# Patient Record
Sex: Male | Born: 1995 | Race: White | Hispanic: No | Marital: Single | Smoking: Former smoker
Health system: Southern US, Community
[De-identification: ages and names within clinical notes are randomized; demographics above are authoritative.]

## PROBLEM LIST (undated history)

## (undated) DIAGNOSIS — G4700 Insomnia, unspecified: Secondary | ICD-10-CM

## (undated) DIAGNOSIS — J45909 Unspecified asthma, uncomplicated: Secondary | ICD-10-CM

---
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
## 2015-07-28 ENCOUNTER — Ambulatory Visit (INDEPENDENT_AMBULATORY_CARE_PROVIDER_SITE_OTHER): Payer: Self-pay

## 2015-07-28 ENCOUNTER — Ambulatory Visit
Admission: EM | Admit: 2015-07-28 | Discharge: 2015-07-28 | Disposition: A | Discharge status: Home / Self Care | Payer: Self-pay | Attending: Family Medicine | Admitting: Family Medicine

## 2015-07-28 DIAGNOSIS — S20212A Contusion of left front wall of thorax, initial encounter: Secondary | ICD-10-CM

## 2015-07-28 DIAGNOSIS — S298XXA Other specified injuries of thorax, initial encounter: Secondary | ICD-10-CM

## 2015-07-28 DIAGNOSIS — J301 Allergic rhinitis due to pollen: Secondary | ICD-10-CM

## 2015-07-28 DIAGNOSIS — H6593 Unspecified nonsuppurative otitis media, bilateral: Secondary | ICD-10-CM

## 2015-07-28 DIAGNOSIS — S29011A Strain of muscle and tendon of front wall of thorax, initial encounter: Secondary | ICD-10-CM

## 2015-07-28 DIAGNOSIS — S2341XA Sprain of ribs, initial encounter: Secondary | ICD-10-CM

## 2015-07-28 HISTORY — DX: Insomnia, unspecified: G47.00

## 2015-07-28 HISTORY — DX: Unspecified asthma, uncomplicated: J45.909

## 2015-07-28 MED ORDER — FLUTICASONE PROPIONATE 50 MCG/ACT NA SUSP: 1.0000 | Freq: Two times a day (BID) | NASAL | Status: DC

## 2015-07-28 MED ORDER — CETIRIZINE HCL 10 MG PO CHEW: 10.0000 mg | CHEWABLE_TABLET | Freq: Every day | ORAL | Status: AC

## 2015-07-28 MED ORDER — SALINE SPRAY 0.65 % NA SOLN: 2.0000 | NASAL | Status: DC

## 2015-07-28 MED ORDER — IBUPROFEN 800 MG PO TABS: 800.0000 mg | ORAL_TABLET | Freq: Three times a day (TID) | ORAL | Status: DC

## 2015-07-28 NOTE — ED Notes (Signed)
States slipped and fell 07/13/15 landing left lateral ribs on edge of brick steps. C/o pain with inspiration and with movements

## 2015-07-28 NOTE — Discharge Instructions (Signed)
Allergic Rhinitis Allergic rhinitis is when the mucous membranes in the nose respond to allergens. Allergens are particles in the air that cause your body to have an allergic reaction. This causes you to release allergic antibodies. Through a chain of events, these eventually cause you to release histamine into the blood stream. Although meant to protect the body, it is this release of histamine that causes your discomfort, such as frequent sneezing, congestion, and an itchy, runny nose.  CAUSES Seasonal allergic rhinitis (hay fever) is caused by pollen allergens that may come from grasses, trees, and weeds. Year-round allergic rhinitis (perennial allergic rhinitis) is caused by allergens such as house dust mites, pet dander, and mold spores. SYMPTOMS  Nasal stuffiness (congestion).  Itchy, runny nose with sneezing and tearing of the eyes. DIAGNOSIS Your health care provider can help you determine the allergen or allergens that trigger your symptoms. If you and your health care provider are unable to determine the allergen, skin or blood testing may be used. Your health care provider will diagnose your condition after taking your health history and performing a physical exam. Your health care provider may assess you for other related conditions, such as asthma, pink eye, or an ear infection. TREATMENT Allergic rhinitis does not have a cure, but it can be controlled by:  Medicines that block allergy symptoms. These may include allergy shots, nasal sprays, and oral antihistamines.  Avoiding the allergen. Hay fever may often be treated with antihistamines in pill or nasal spray forms. Antihistamines block the effects of histamine. There are over-the-counter medicines that may help with nasal congestion and swelling around the eyes. Check with your health care provider before taking or giving this medicine. If avoiding the allergen or the medicine prescribed do not work, there are many new medicines  your health care provider can prescribe. Stronger medicine may be used if initial measures are ineffective. Desensitizing injections can be used if medicine and avoidance does not work. Desensitization is when a patient is given ongoing shots until the body becomes less sensitive to the allergen. Make sure you follow up with your health care provider if problems continue. HOME CARE INSTRUCTIONS It is not possible to completely avoid allergens, but you can reduce your symptoms by taking steps to limit your exposure to them. It helps to know exactly what you are allergic to so that you can avoid your specific triggers. SEEK MEDICAL CARE IF:  You have a fever.  You develop a cough that does not stop easily (persistent).  You have shortness of breath.  You start wheezing.  Symptoms interfere with normal daily activities.   This information is not intended to replace advice given to you by your health care provider. Make sure you discuss any questions you have with your health care provider.   Document Released: 03/10/2001 Document Revised: 07/06/2014 Document Reviewed: 02/20/2013 Elsevier Interactive Patient Education 2016 Else[employer] Inc[employer]  Blunt Chest Trauma Blunt chest trauma is an injury caused by a blow to the chest. These chest injuries can be very painful. Blunt chest trauma often results in bruised or broken (fractured) ribs. Most cases of bruised and fractured ribs from blunt chest traumas get better after 1 to 3 weeks of rest and pain medicine. Often, the soft tissue in the chest wall is also injured, causing pain and bruising. Internal organs, such as the heart and lungs, may also be injured. Blunt chest trauma can lead to serious medical problems. This injury requires immediate medical care. CAUSES  Motor vehicle collisions.  Falls.  Physical violence.  Sports injuries. SYMPTOMS   Chest pain. The pain may be worse when you move or breathe deeply.  Shortness of  breath.  Lightheadedness.  Bruising.  Tenderness.  Swelling. DIAGNOSIS  Your caregiver will do a physical exam. X-rays may be taken to look for fractures. However, minor rib fractures may not show up on X-rays until a few days after the injury. If a more serious injury is suspected, further imaging tests may be done. This may include ultrasounds, computed tomography (CT) scans, or magnetic resonance imaging (MRI). TREATMENT  Treatment depends on the severity of your injury. Your caregiver may prescribe pain medicines and deep breathing exercises. HOME CARE INSTRUCTIONS  Limit your activities until you can move around without much pain.  Do not do any strenuous work until your injury is healed.  Put ice on the injured area.  Put ice in a plastic bag.  Place a towel between your skin and the bag.  Leave the ice on for 15-20 minutes, 03-04 times a day.  You may wear a rib belt as directed by your caregiver to reduce pain.  Practice deep breathing as directed by your caregiver to keep your lungs clear.  Only take over-the-counter or prescription medicines for pain, fever, or discomfort as directed by your caregiver. SEEK IMMEDIATE MEDICAL CARE IF:   You have increasing pain or shortness of breath.  You cough up blood.  You have nausea, vomiting, or abdominal pain.  You have a fever.  You feel dizzy, weak, or you faint. MAKE SURE YOU:  Understand these instructions. Will watch your condition.  Rib Contusion A rib contusion is a deep bruise on your rib area. Contusions are the result of a blunt trauma that causes bleeding and injury to the tissues under the skin. A rib contusion may involve bruising of the ribs and of the skin and muscles in the area. The skin overlying the contusion may turn blue, purple, or yellow. Minor injuries will give you a painless contusion, but more severe contusions may stay painful and swollen for a few weeks. CAUSES  A contusion is usually  caused by a blow, trauma, or direct force to an area of the body. This often occurs while playing contact sports. SYMPTOMS Swelling and redness of the injured area. Discoloration of the injured area. Tenderness and soreness of the injured area. Pain with or without movement. DIAGNOSIS  The diagnosis can be made by taking a medical history and performing a physical exam. An X-ray, CT scan, or MRI may be needed to determine if there were any associated injuries, such as broken bones (fractures) or internal injuries. TREATMENT  Often, the best treatment for a rib contusion is rest. Icing or applying cold compresses to the injured area may help reduce swelling and inflammation. Deep breathing exercises may be recommended to reduce the risk of partial lung collapse and pneumonia. Over-the-counter or prescription medicines may also be recommended for pain control. HOME CARE INSTRUCTIONS  Apply ice to the injured area: Put ice in a plastic bag. Place a towel between your skin and the bag. Leave the ice on for 20 minutes, 2-3 times per day. Take medicines only as directed by your health care provider. Rest the injured area. Avoid strenuous activity and any activities or movements that cause pain. Be careful during activities and avoid bumping the injured area. Perform deep-breathing exercises as directed by your health care provider. Do not lift anything that is  heavier than 5 lb (2.3 kg) until your health care provider approves. Do not use any tobacco products, including cigarettes, chewing tobacco, or electronic cigarettes. If you need help quitting, ask your health care provider. SEEK MEDICAL CARE IF:  You have increased bruising or swelling. You have pain that is not controlled with treatment. You have a fever. SEEK IMMEDIATE MEDICAL CARE IF:  You have difficulty breathing or shortness of breath. You develop a continual cough, or you cough up thick or bloody sputum. You feel sick to your  stomach (nauseous), you throw up (vomit), or you have abdominal pain.   This information is not intended to replace advice given to you by your health care provider. Make sure you discuss any questions you have with your health care provider.   Document Released: 03/10/2001 Document Revised: 07/06/2014 Document Reviewed: 03/27/2014 Elsevier Interactive Patient Education [employer]    Will get help right away if you are not doing well or get worse.   This information is not intended to replace advice given to you by your health care provider. Make sure you discuss any questions you have with your health care provider.   Document Released: 07/23/2004 Document Revised: 07/06/2014 Document Reviewed: 12/12/2014 Elsevier Interactive Patient Education 2016 Else[employer] Inc. Otitis Media With Effusion Otitis media with effusion is the presence of fluid in the middle ear. This is a common problem in children, which often follows ear infections. It may be present for weeks or longer after the infection. Unlike an acute ear infection, otitis media with effusion refers only to fluid behind the ear drum and not infection. Children with repeated ear and sinus infections and allergy problems are the most likely to get otitis media with effusion. CAUSES  The most frequent cause of the fluid buildup is dysfunction of the eustachian tubes. These are the tubes that drain fluid in the ears to the back of the nose (nasopharynx). SYMPTOMS   The main symptom of this condition is hearing loss. As a result, you or your child may:  Listen to the TV at a loud volume.  Not respond to questions.  Ask "what" often when spoken to.  Mistake or confuse one sound or word for another.  There may be a sensation of fullness or pressure but usually not pain. DIAGNOSIS   Your health care provider will diagnose this condition by examining you or your child's ears.  Your health care provider may test the pressure  in you or your child's ear with a tympanometer.  A hearing test may be conducted if the problem persists. TREATMENT   Treatment depends on the duration and the effects of the effusion.  Antibiotics, decongestants, nose drops, and cortisone-type drugs (tablets or nasal spray) may not be helpful.  Children with persistent ear effusions may have delayed language or behavioral problems. Children at risk for developmental delays in hearing, learning, and speech may require referral to a specialist earlier than children not at risk.  You or your child's health care provider may suggest a referral to an ear, nose, and throat surgeon for treatment. The following may help restore normal hearing:  Drainage of fluid.  Placement of ear tubes (tympanostomy tubes).  Removal of adenoids (adenoidectomy). HOME CARE INSTRUCTIONS   Avoid secondhand smoke.  Infants who are breastfed are less likely to have this condition.  Avoid feeding infants while they are lying flat.  Avoid known environmental allergens.  Avoid people who are sick. SEEK MEDICAL CARE IF:  Hearing is not better in 3 months.  Hearing is worse.  Ear pain.  Drainage from the ear.  Dizziness. MAKE SURE YOU:   Understand these instructions.  Will watch your condition.  Will get help right away if you are not doing well or get worse.   This information is not intended to replace advice given to you by your health care provider. Make sure you discuss any questions you have with your health care provider.   Document Released: 07/23/2004 Document Revised: 07/06/2014 Document Reviewed: 01/10/2013 Elsevier Interactive Patient Education [employer]

## 2015-07-28 NOTE — ED Provider Notes (Signed)
CSN: [id number]     Arrival date & time 07/28/15  1421 History   First MD Initiated Contact with Patient 07/28/15 1512     Chief Complaint  Patient presents with  . Rib Injury   (Consider location/radiation/quality/duration/timing/severity/associated sxs/prior Treatment) HPI Comments: Single caucasian male fell in the rain on steps while walking dog 13 Jul 2015 still having pain thinks he may have broken rib here for evaluation  Pain with deep breaths and certain movements.  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  The history is provided by the patient.    Past Medical History  Diagnosis Date  . Asthma   . Insomnia    History reviewed. No pertinent past surgical history. History reviewed. No pertinent family history. Social History  Substance Use Topics  . Smoking status: Former Games developer  . Smokeless tobacco: None  . Alcohol Use: No    Review of Systems  Constitutional: Negative for fever, chills, diaphoresis, activity change, appetite change, fatigue and unexpected weight change.  HENT: Positive for congestion and postnasal drip. Negative for dental problem, drooling, ear discharge, ear pain, facial swelling, hearing loss, mouth sores, nosebleeds, rhinorrhea, sinus pressure, sneezing, sore throat, tinnitus, trouble swallowing and voice change.   Eyes: Negative for photophobia, pain, discharge, redness, itching and visual disturbance.  Respiratory: Positive for cough. Negative for choking, chest tightness, shortness of breath, wheezing and stridor.   Cardiovascular: Positive for chest pain. Negative for palpitations and leg swelling.  Gastrointestinal: Negative for nausea, vomiting, abdominal pain, diarrhea, constipation, blood in stool and abdominal distention.  Endocrine: Negative for cold intolerance and heat intolerance.  Genitourinary: Negative for dysuria.  Musculoskeletal: Negative for myalgias,  back pain, joint swelling, arthralgias, gait problem, neck pain and neck stiffness.  Skin: Negative for color change, pallor, rash and wound.  Allergic/Immunologic: Positive for environmental allergies. Negative for food allergies and immunocompromised state.  Neurological: Negative for dizziness, tremors, seizures, syncope, facial asymmetry, speech difficulty, weakness, light-headedness, numbness and headaches.  Hematological: Negative for adenopathy. Does not bruise/bleed easily.  Psychiatric/Behavioral: Negative for behavioral problems, confusion, sleep disturbance and agitation.    Allergies  Peanuts and Shellfish allergy  Home Medications   Prior to Admission medications   Medication Sig Start Date End Date Taking? Authorizing Provider  cetirizine (ZYRTEC) 10 MG chewable tablet Chew 1 tablet (10 mg total) by mouth daily. 07/28/15 08/27/15  Barbaraann Barthel, NP  fluticasone (FLONASE) 50 MCG/ACT nasal spray Place 1 spray into both nostrils 2 (two) times daily. 07/28/15   Barbaraann Barthel, NP  ibuprofen (ADVIL,MOTRIN) 800 MG tablet Take 1 tablet (800 mg total) by mouth 3 (three) times daily. 07/28/15   Barbaraann Barthel, NP  sodium chloride (OCEAN) 0.65 % SOLN nasal spray Place 2 sprays into both nostrils every 2 (two) hours while awake. 07/28/15   Barbaraann Barthel, NP   Meds Ordered and Administered this Visit  Medications - No data to display  BP 146/61 mmHg  Pulse 76  Temp(Src) 97.5 F (36.4 C) (Tympanic)  Resp 16  Ht  (1.676 m)  Wt 160 lb (72.576 kg)  BMI 25.84 kg/m2  SpO2 100% No data found.   Physical Exam  Constitutional: He is oriented to person, place, and time. Vital signs are normal. He appears well-developed and well-nourished. He is active and cooperative.  Non-toxic appearance. He does not have a sickly appearance. He does not appear ill. No  distress.  HENT:  Head: Normocephalic and atraumatic.  Right Ear: External ear and ear canal normal. A middle ear  effusion is present.  Left Ear: Hearing, external ear and ear canal normal. A middle ear effusion is present.  Nose: Mucosal edema and rhinorrhea present. No nose lacerations, sinus tenderness, nasal deformity, septal deviation or nasal septal hematoma. No epistaxis.  No foreign bodies. Right sinus exhibits no maxillary sinus tenderness and no frontal sinus tenderness. Left sinus exhibits no maxillary sinus tenderness and no frontal sinus tenderness.  Mouth/Throat: Uvula is midline and mucous membranes are normal. Mucous membranes are not pale, not dry and not cyanotic. He does not have dentures. No oral lesions. No trismus in the jaw. Normal dentition. No dental abscesses, uvula swelling, lacerations or dental caries. Posterior oropharyngeal edema and posterior oropharyngeal erythema present. No oropharyngeal exudate or tonsillar abscesses.  Cobblestoning posterior pharynx; bilateral TMs with air fluid level slight opacity; bilateral nasal turbinates with edema/erythema  Eyes: Conjunctivae, EOM and lids are normal. Pupils are equal, round, and reactive to light. Right eye exhibits no chemosis, no discharge, no exudate and no hordeolum. No foreign body present in the right eye. Left eye exhibits no chemosis, no discharge, no exudate and no hordeolum. No foreign body present in the left eye. Right conjunctiva is not injected. Right conjunctiva has no hemorrhage. Left conjunctiva is not injected. Left conjunctiva has no hemorrhage. No scleral icterus. Right eye exhibits normal extraocular motion and no nystagmus. Left eye exhibits normal extraocular motion and no nystagmus. Right pupil is round and reactive. Left pupil is round and reactive. Pupils are equal.  Neck: Trachea normal and normal range of motion. Neck supple. No tracheal tenderness, no spinous process tenderness and no muscular tenderness present. No rigidity. No tracheal deviation, no edema, no erythema and normal range of motion present. No thyroid  mass and no thyromegaly present.  Cardiovascular: Normal rate, regular rhythm, S1 normal, S2 normal, normal heart sounds and intact distal pulses.  PMI is not displaced.  Exam reveals no gallop and no friction rub.   No murmur heard.   Pulmonary/Chest: Effort normal and breath sounds normal. No stridor. No respiratory distress. He has no decreased breath sounds. He has no wheezes. He has no rhonchi. He has no rales.  Abdominal: Soft. He exhibits no distension.  Musculoskeletal: Normal range of motion. He exhibits no edema or tenderness.       Right shoulder: Normal.       Left shoulder: Normal.       Right elbow: Normal.      Left elbow: Normal.       Right hip: Normal.       Left hip: Normal.       Right knee: Normal.       Left knee: Normal.       Cervical back: Normal.       Thoracic back: Normal.       Lumbar back: Normal.       Right hand: Normal.       Left hand: Normal.  Lymphadenopathy:       Head (right side): No submental, no submandibular, no tonsillar, no preauricular, no posterior auricular and no occipital adenopathy present.       Head (left side): No submental, no submandibular, no tonsillar, no preauricular, no posterior auricular and no occipital adenopathy present.    He has no cervical adenopathy.       Right cervical: No superficial cervical, no deep cervical  and no posterior cervical adenopathy present.      Left cervical: No superficial cervical, no deep cervical and no posterior cervical adenopathy present.  Neurological: He is alert and oriented to person, place, and time. He displays no atrophy and no tremor. No cranial nerve deficit or sensory deficit. He exhibits normal muscle tone. He displays no seizure activity. Coordination and gait normal. GCS eye subscore is 4. GCS verbal subscore is 5. GCS motor subscore is 6.  Skin: Skin is warm, dry and intact. Ecchymosis noted. No abrasion, no bruising, no burn, no laceration, no lesion, no petechiae and no rash  noted. He is not diaphoretic. No cyanosis or erythema. No pallor. Nails show no clubbing.     Psychiatric: He has a normal mood and affect. His speech is normal and behavior is normal. Judgment and thought content normal. Cognition and memory are normal.  Nursing note and vitals reviewed.   ED Course  Procedures (including critical care time)  Labs Review Labs Reviewed - No data to display  Imaging Review Dg Ribs Unilateral W/chest Left  07/28/2015  CLINICAL DATA:  Larey Seat 2 weeks ago on his left side with persistent left rib pain. EXAM: LEFT RIBS AND CHEST - 3+ VIEW COMPARISON:  None. FINDINGS: The lungs are clear without focal consolidation, edema, effusion or pneumothorax. Cardio pericardial silhouette is within normal limits for size. Imaged bony structures of the thorax are intact. Oblique views of the left ribs were obtained with a radiopaque BB localizing the area of patient concern. No evidence for left-sided rib fracture. IMPRESSION: Negative. Electronically Signed   By: Kennith Center M.D.   On: 07/28/2015 15:22    1515 patient at xray.   1530 Discussed xray results with patient given copy of radiology report negative for fracture or pneumothorax, fluid in lungs.  Probable contusion from blunt trauma, intercostal muscle strain.  Plants budding due to warm weather seasonal spring allergies may be starting earlier than usual and patient to restart his zyrtec  po daily.  Patient to follow up for re-evaluation if worsening pain, SOB, hemoptysis, productive cough.  Patient verbalized understanding of information/instructions, agreed with plan of care and had no further questions at this time. MDM   1. Otitis media with effusion, bilateral   2. Rib contusion, left, initial encounter   3. Intercostal muscle strain, initial encounter   4. Allergic rhinitis due to pollen    Supportive treatment.   No evidence of invasive bacterial infection, non toxic and well hydrated.  This is most  likely self limiting viral infection.  I do not see where any further testing or imaging is necessary at this time.   I will suggest supportive care, rest, good hygiene and encourage the patient to take adequate fluids.  The patient is to return to clinic or EMERGENCY ROOM if symptoms worsen or change significantly e.g. ear pain, fever, purulent discharge from ears or bleeding.  Exitcare handout on otitis media with effusion given to patient.  Patient verbalized agreement and understanding of treatment plan.    Patient given work excuse for today.  Patient was instructed to rest, ice, NSAID of choice x 2 weeks and ROM exercises.  Activity as tolerated.  Patient is to take OTC po NSAIDS as needed.  Exitcare handout on blunt trauma, rib contusion given to patient.  Follow up with PCM if no improvement in symptoms with prescribed care.  Patient verbalized agreement and understanding of treatment plan and had no further questions at this  time.  P2:  Injury Prevention and Fitness.  Restart zyrtec 10mg  po daily.  If no relief add flonase 1 spray each nostril BID and nasal saline 2 sprays each nostril q2h prn congestion.  Patient may use normal saline nasal spray as needed.  Consider antihistamine or nasal steroid use.  Avoid triggers if possible.  Shower prior to bedtime if exposed to triggers.  If allergic dust/dust mites recommend mattress/pillow covers/encasements; washing linens, vacuuming, sweeping, dusting weekly.  Call or return to clinic as needed if these symptoms worsen or fail to improve as anticipated.   Exitcare handout on allergic rhinitis given to patient.  Patient verbalized understanding of instructions, agreed with plan of care and had no further questions at this time.  P2:  Avoidance and hand washing.    Barbaraann Barthel, NP 07/28/15 1554

## 2016-11-03 DIAGNOSIS — F411 Generalized anxiety disorder: Secondary | ICD-10-CM | POA: Insufficient documentation

## 2016-11-03 DIAGNOSIS — Z8659 Personal history of other mental and behavioral disorders: Secondary | ICD-10-CM | POA: Insufficient documentation

## 2016-11-03 DIAGNOSIS — F5101 Primary insomnia: Secondary | ICD-10-CM | POA: Insufficient documentation

## 2017-05-08 IMAGING — CR DG RIBS W/ CHEST 3+V*L*
5 series · 5 of 5 positions shown · non-contrast
Comparison: None.

CLINICAL DATA: Fell 2 weeks ago on his left side with persistent
left rib pain.

EXAM:
LEFT RIBS AND CHEST - 3+ VIEW

[chest pa]
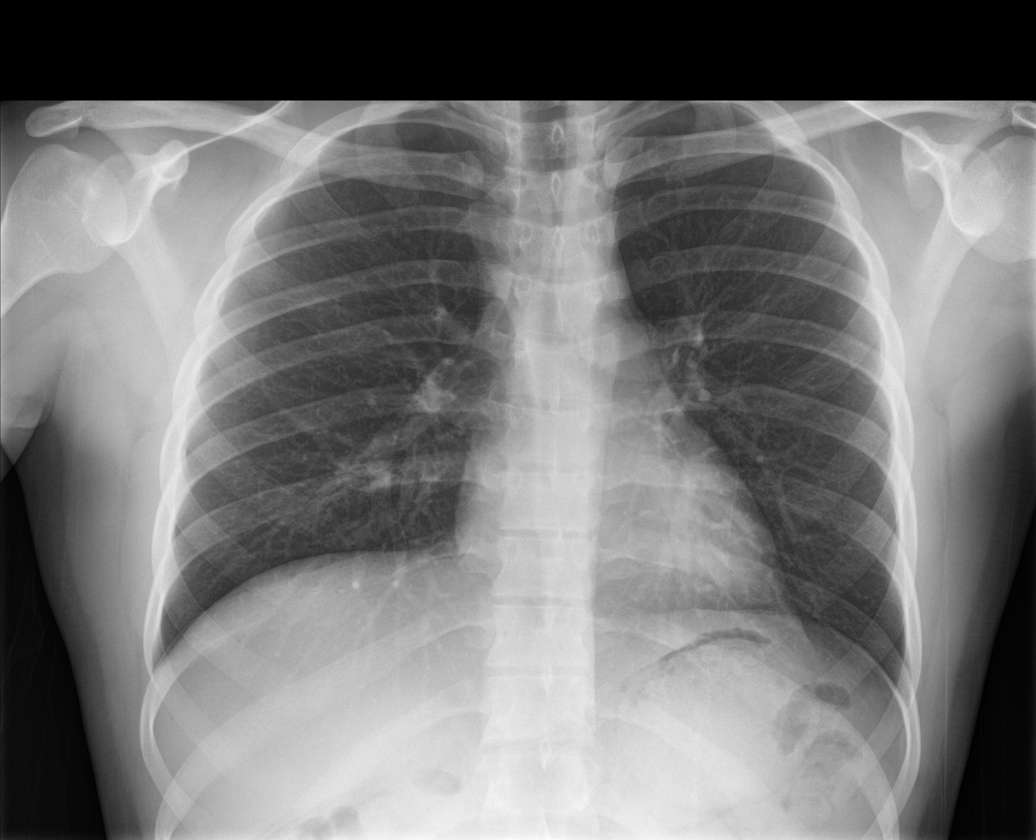

[rib obl (1 of 2)]
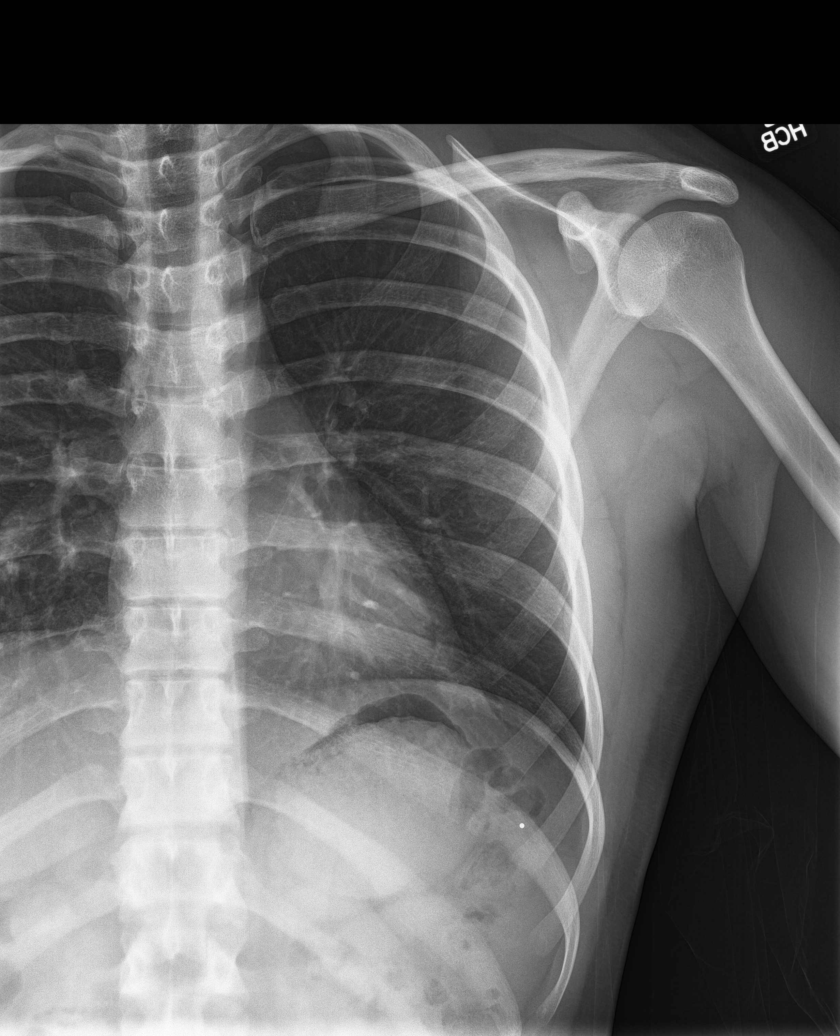

[rib obl (2 of 2)]
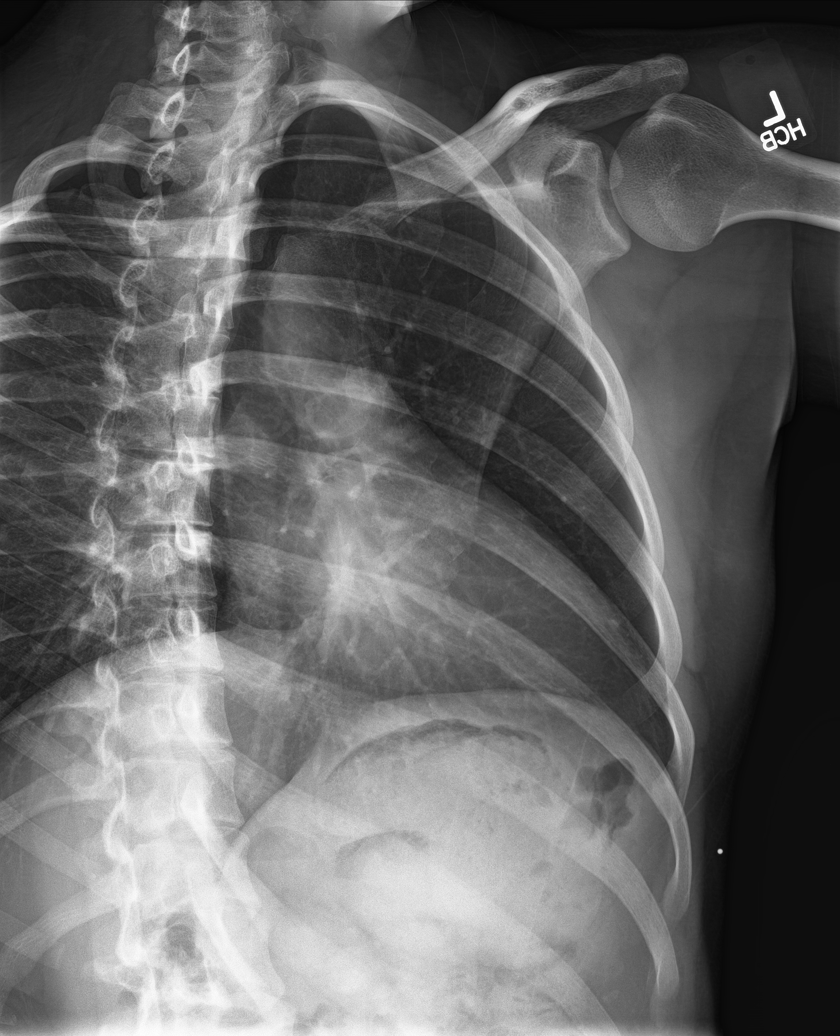

[rib pa (1 of 2)]
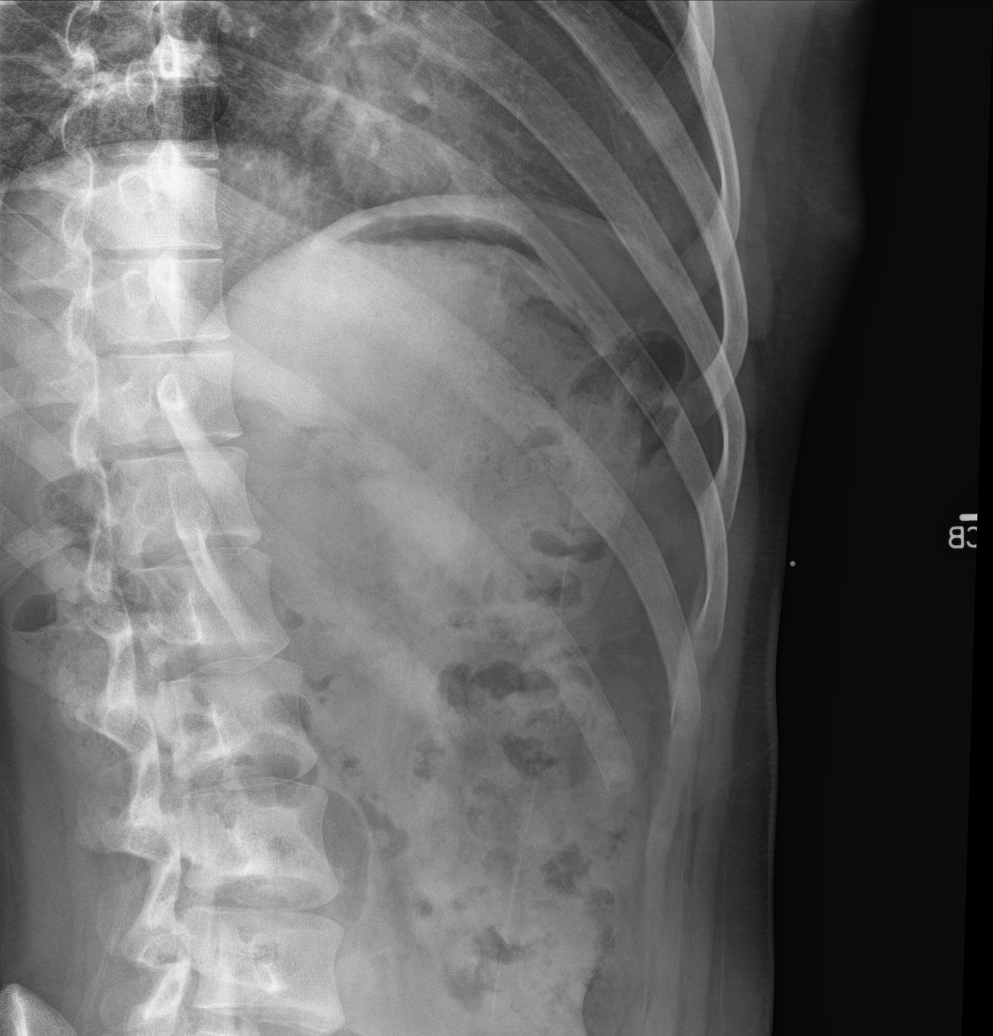

[rib pa (2 of 2)]
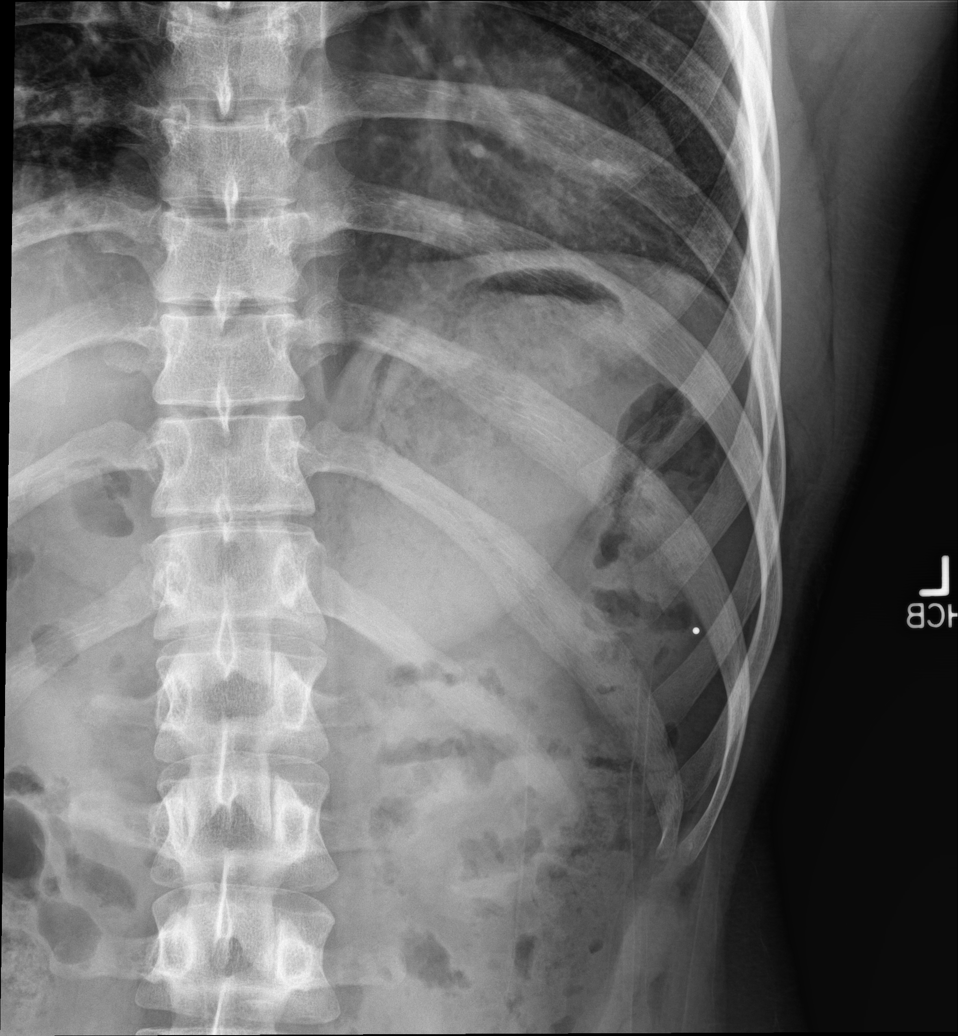

[5 of 5 positions shown; findings below may reference images not displayed]

FINDINGS: The lungs are clear without focal consolidation, edema, effusion or
pneumothorax. Cardio pericardial silhouette is within normal limits
for size. Imaged bony structures of the thorax are intact.

Oblique views of the left ribs were obtained with a radiopaque BB
localizing the area of patient concern. No evidence for left-sided
rib fracture.
IMPRESSION: Negative.

## 2019-04-17 ENCOUNTER — Other Ambulatory Visit: Payer: Self-pay

## 2019-04-17 ENCOUNTER — Ambulatory Visit
Admission: EM | Admit: 2019-04-17 | Discharge: 2019-04-17 | Disposition: A | Discharge status: Home / Self Care | Payer: 59 | Attending: Family Medicine | Admitting: Family Medicine

## 2019-04-17 DIAGNOSIS — W5503XA Scratched by cat, initial encounter: Secondary | ICD-10-CM | POA: Diagnosis not present

## 2019-04-17 DIAGNOSIS — S51812A Laceration without foreign body of left forearm, initial encounter: Secondary | ICD-10-CM | POA: Diagnosis not present

## 2019-04-17 MED ORDER — AZITHROMYCIN 250 MG PO TABS: ORAL_TABLET | ORAL | 0 refills | Status: DC

## 2019-04-17 NOTE — ED Triage Notes (Signed)
Patient states that he was scratched by his cat this morning. Patient states that the cat used both paws on his left forearm and he pulled his arm away making it worse. Patient states that the cat is UTD with vaccines.

## 2019-04-17 NOTE — ED Provider Notes (Signed)
[facility]    CSN: [id number] Arrival date & time: 04/17/19  1504  History   Chief Complaint Chief Complaint  Patient presents with  . Cat Scratch   HPI  23 year old male presents with the above complaint.  Patient states that he was playing with his [place] today.  Cat got startled and scratched him on his left forearm.  He has several superficial scratches, 2 of which are slightly gaping.  All are superficial.  Mild bleeding.  Wounds are clean.  He is up-to-date on his tetanus vaccination.  Cat is up-to-date on her immunizations.  Minimal pain at this time, 2/10 in severity.  No exacerbating factors.  No other associated symptoms.  No other complaints.  Hx reviewed as below. Past Medical History:  Diagnosis Date  . Asthma   . Insomnia    Home Medications    Prior to Admission medications   Medication Sig Start Date End Date Taking? Authorizing Provider  cetirizine (ZYRTEC) 10 MG chewable tablet Chew 1 tablet (10 mg total) by mouth daily. 07/28/15 04/17/19 Yes Betancourt, Jarold Song, NP  meloxicam (MOBIC) 7.5 MG tablet Take by mouth. 01/18/19 01/18/20 Yes [provider]  azithromycin (ZITHROMAX) 250 MG tablet 2 tablets on day 1, then 1 tablet daily on days 2-5. 04/17/19   Ayauna Mcnay, Dorie Rank G, DO  fluticasone (FLONASE) 50 MCG/ACT nasal spray Place 1 spray into both nostrils 2 (two) times daily. 07/28/15 04/17/19  Betancourt, Jarold Song, NP  sodium chloride (OCEAN) 0.65 % SOLN nasal spray Place 2 sprays into both nostrils every 2 (two) hours while awake. 07/28/15 04/17/19  Betancourt, Jarold Song, NP   Social History Social History   Tobacco Use  . Smoking status: Former Games developer  . Smokeless tobacco: Never Used  Substance Use Topics  . Alcohol use: No  . Drug use: Never   Allergies   Peanuts [peanut oil] and Shellfish allergy   Review of Systems Review of Systems  Constitutional: Negative.   Skin: Positive for wound.   Physical Exam Triage Vital Signs ED  Triage Vitals  Enc Vitals Group     BP 04/17/19 0854 (!) 143/80     Pulse Rate 04/17/19 0854 89     Resp 04/17/19 0854 16     Temp 04/17/19 0854 98.9 F (37.2 C)     Temp Source 04/17/19 0854 Oral     SpO2 04/17/19 0854 99 %     Weight 04/17/19 0851 178 lb 9.2 oz (81 kg)     Height 04/17/19 0851 5\' 6"  (1.676 m)     Head Circumference --      Peak Flow --      Pain Score 04/17/19 0850 2     Pain Loc --      Pain Edu? --      Excl. in GC? --    Updated Vital Signs BP (!) 143/80 (BP Location: Left Arm)   Pulse 89   Temp 98.9 F (37.2 C) (Oral)   Resp 16   Ht 5\' 6"  (1.676 m)   Wt 81 kg   SpO2 99%   BMI 28.82 kg/m   Visual Acuity Right Eye Distance:   Left Eye Distance:   Bilateral Distance:    Right Eye Near:   Left Eye Near:    Bilateral Near:     Physical Exam Vitals signs and nursing note reviewed.  Constitutional:      General: He is not in acute distress.  Appearance: Normal appearance. He is not ill-appearing.  HENT:     Head: Normocephalic and atraumatic.  Eyes:     General:        Right eye: No discharge.        Left eye: No discharge.     Conjunctiva/sclera: Conjunctivae normal.  Pulmonary:     Effort: Pulmonary effort is normal. No respiratory distress.  Skin:    Comments: Dorsum of the forearm (left) with 4 superficial wounds, one of which is slightly gaping (~2 cm).  Volar aspect of the forearm with 4 superficial wounds, one of which is slightly gaping (~ 2 cm)  Neurological:     Mental Status: He is alert.  Psychiatric:        Mood and Affect: Mood normal.        Behavior: Behavior normal.    UC Treatments / Results  Labs (all labs ordered are listed, but only abnormal results are displayed) Labs Reviewed - No data to display  EKG   Radiology No results found.  Procedures Laceration Repair  Date/Time: 04/17/2019 9:42 AM Performed by: Coral Spikes, DO Authorized by: Coral Spikes, DO   Consent:    Consent obtained:  Verbal    Consent given by:  Patient Anesthesia (see MAR for exact dosages):    Anesthesia method:  None Laceration details:    Location:  Shoulder/arm   Shoulder/arm location:  L lower arm   Length (cm):  2 (Two 2 cm lacerations) Repair type:    Repair type:  Simple Pre-procedure details:    Preparation:  Patient was prepped and draped in usual sterile fashion Exploration:    Hemostasis achieved with:  Direct pressure   Wound exploration: entire depth of wound probed and visualized     Contaminated: no   Treatment:    Area cleansed with:  Betadine and saline   Amount of cleaning:  Standard   Irrigation solution:  Sterile saline   Irrigation method:  Syringe Skin repair:    Repair method:  Tissue adhesive Approximation:    Approximation:  Close Post-procedure details:    Dressing:  Open (no dressing)   Patient tolerance of procedure:  Tolerated well, no immediate complications   (including critical care time)  Medications Ordered in UC Medications - No data to display  Initial Impression / Assessment and Plan / UC Course  I have reviewed the triage vital signs and the nursing notes.  Pertinent labs & imaging results that were available during my care of the patient were reviewed by me and considered in my medical decision making (see chart for details).    23 year old male presents with cat scratches/lacerations.  2 areas closed with Dermabond.  Antibiotic prophylaxis with azithromycin to cover for cat scratch disease.  Work note given.  Supportive care.  Final Clinical Impressions(s) / UC Diagnoses   Final diagnoses:  Cat scratch  Laceration of left forearm, initial encounter     Discharge Instructions     Antibiotic as prescribed.  Take care  Dr Lacinda Axon   ED Prescriptions    Medication Sig Dispense Auth. Provider   azithromycin (ZITHROMAX) 250 MG tablet 2 tablets on day 1, then 1 tablet daily on days 2-5. 6 tablet Coral Spikes, DO     PDMP not reviewed this  encounter.   Coral Spikes, [place] 04/17/19 0945

## 2019-04-17 NOTE — Discharge Instructions (Signed)
Antibiotic as prescribed.  Take care  Dr. Aaliya Maultsby  

## 2020-03-02 ENCOUNTER — Emergency Department: Admission: EM | Admit: 2020-03-02 | Discharge: 2020-03-02 | Discharge status: Against Medical Advice | Payer: 59

## 2020-03-02 NOTE — ED Triage Notes (Signed)
Called no answer

## 2020-09-25 ENCOUNTER — Emergency Department
Admission: EM | Admit: 2020-09-25 | Discharge: 2020-09-25 | Disposition: A | Discharge status: Home / Self Care | Payer: 59 | Attending: Emergency Medicine | Admitting: Emergency Medicine

## 2020-09-25 ENCOUNTER — Encounter: Payer: Self-pay | Admitting: Emergency Medicine

## 2020-09-25 ENCOUNTER — Other Ambulatory Visit: Payer: Self-pay

## 2020-09-25 DIAGNOSIS — Z91010 Allergy to peanuts: Secondary | ICD-10-CM | POA: Diagnosis not present

## 2020-09-25 DIAGNOSIS — J45909 Unspecified asthma, uncomplicated: Secondary | ICD-10-CM | POA: Diagnosis not present

## 2020-09-25 DIAGNOSIS — G43809 Other migraine, not intractable, without status migrainosus: Secondary | ICD-10-CM | POA: Diagnosis not present

## 2020-09-25 DIAGNOSIS — Z87891 Personal history of nicotine dependence: Secondary | ICD-10-CM | POA: Insufficient documentation

## 2020-09-25 DIAGNOSIS — R42 Dizziness and giddiness: Secondary | ICD-10-CM | POA: Insufficient documentation

## 2020-09-25 DIAGNOSIS — R519 Headache, unspecified: Secondary | ICD-10-CM | POA: Diagnosis present

## 2020-09-25 MED ORDER — METOCLOPRAMIDE HCL 5 MG/ML IJ SOLN
5.0000 mg | Freq: Once | INTRAMUSCULAR | Status: AC
  Administered 2020-09-25: 5 mg via INTRAVENOUS
  Filled 2020-09-25: qty 2

## 2020-09-25 MED ORDER — METOCLOPRAMIDE HCL 10 MG PO TABS: 10.0000 mg | ORAL_TABLET | Freq: Three times a day (TID) | ORAL | 0 refills | Status: AC | PRN

## 2020-09-25 MED ORDER — KETOROLAC TROMETHAMINE 30 MG/ML IJ SOLN
15.0000 mg | Freq: Once | INTRAMUSCULAR | Status: AC
  Administered 2020-09-25: 15 mg via INTRAVENOUS
  Filled 2020-09-25: qty 1

## 2020-09-25 MED ORDER — DEXAMETHASONE SODIUM PHOSPHATE 10 MG/ML IJ SOLN
10.0000 mg | Freq: Once | INTRAMUSCULAR | Status: AC
  Administered 2020-09-25: 10 mg via INTRAVENOUS
  Filled 2020-09-25: qty 1

## 2020-09-25 NOTE — Discharge Instructions (Addendum)
You may take Reglan as needed for headache.  Return to the ER for worsening symptoms, persistent vomiting, lethargy or other concerns. ?

## 2020-09-25 NOTE — ED Notes (Signed)
Pt states that the headache is getting better.

## 2020-09-25 NOTE — ED Triage Notes (Signed)
Patient ambulatory to triage with steady gait, without difficulty or distress noted; pt reports left sided HA x 4 days accomp by nausea and dizziness

## 2020-09-25 NOTE — ED Provider Notes (Signed)
[facility] Emergency Department Provider Note   ____________________________________________   Event Date/Time   First MD Initiated Contact with Patient 09/25/20 0530     (approximate)  I have reviewed the triage vital signs and the nursing notes.   HISTORY  Chief Complaint Headache    HPI Terrall Bley is a 25 y.o. male who presents to the ED from home with a chief complaint of headache.  Patient has a history of migraine disorder, on Imitrex and Topamax.  Reports left-sided headache x4 days associated with nausea and dizziness.  Typical of migraine headaches.  Denies photophobia, fever, neck pain, chest pain, shortness of breath, abdominal pain, vomiting.     Past Medical History:  Diagnosis Date  . Asthma   . Insomnia     There are no problems to display for this patient.   History reviewed. No pertinent surgical history.  Prior to Admission medications   Medication Sig Start Date End Date Taking? Authorizing Provider  metoCLOPramide (REGLAN) 10 MG tablet Take 1 tablet (10 mg total) by mouth every 8 (eight) hours as needed for nausea (headache). 09/25/20  Yes Irean Hong, MD  azithromycin (ZITHROMAX) 250 MG tablet 2 tablets on day 1, then 1 tablet daily on days 2-5. 04/17/19   Tommie Sams, DO  cetirizine (ZYRTEC) 10 MG chewable tablet Chew 1 tablet (10 mg total) by mouth daily. 07/28/15 04/17/19  Betancourt, Jarold Song, NP  fluticasone (FLONASE) 50 MCG/ACT nasal spray Place 1 spray into both nostrils 2 (two) times daily. 07/28/15 04/17/19  Betancourt, Jarold Song, NP  sodium chloride (OCEAN) 0.65 % SOLN nasal spray Place 2 sprays into both nostrils every 2 (two) hours while awake. 07/28/15 04/17/19  Betancourt, Jarold Song, NP    Allergies Peanuts [peanut oil] and Shellfish allergy  No family history on file.  Social History Social History   Tobacco Use  . Smoking status: Former Games developer  . Smokeless tobacco: Never Used  Vaping Use  . Vaping Use:  Never used  Substance Use Topics  . Alcohol use: No  . Drug use: Never    Review of Systems  Constitutional: No fever/chills Eyes: No visual changes. ENT: No sore throat. Cardiovascular: Denies chest pain. Respiratory: Denies shortness of breath. Gastrointestinal: No abdominal pain.  No nausea, no vomiting.  No diarrhea.  No constipation. Genitourinary: Negative for dysuria. Musculoskeletal: Negative for back pain. Skin: Negative for rash. Neurological: Positive for headache. Negative for focal weakness or numbness.   ____________________________________________   PHYSICAL EXAM:  VITAL SIGNS: ED Triage Vitals [09/25/20 0529]  Enc Vitals Group     BP      Pulse      Resp      Temp      Temp src      SpO2      Weight 190 lb (86.2 kg)     Height 5\' 5"  (1.651 m)     Head Circumference      Peak Flow      Pain Score 7     Pain Loc      Pain Edu?      Excl. in GC?     Constitutional: Alert and oriented. Well appearing and in no acute distress. Eyes: Conjunctivae are normal. PERRL. EOMI. Head: Atraumatic. Nose: No congestion/rhinnorhea. Mouth/Throat: Mucous membranes are moist.   Neck: No stridor.  No carotid bruits.  Supple neck without meningismus. Cardiovascular: Normal rate, regular rhythm. Grossly normal heart sounds.  Good peripheral circulation.  Respiratory: Normal respiratory effort.  No retractions. Lungs CTAB. Gastrointestinal: Soft and nontender. No distention. No abdominal bruits. No CVA tenderness. Musculoskeletal: No lower extremity tenderness nor edema.  No joint effusions. Neurologic: Alert and oriented x3.  CN II XII grossly intact.  Normal speech and language. No gross focal neurologic deficits are appreciated. No gait instability. Skin:  Skin is warm, dry and intact. No rash noted. Psychiatric: Mood and affect are normal. Speech and behavior are normal.  ____________________________________________   LABS (all labs ordered are listed, but only  abnormal results are displayed)  Labs Reviewed - No data to display ____________________________________________  EKG  None ____________________________________________  RADIOLOGY I, Keyasha Miah J, personally viewed and evaluated these images (plain radiographs) as part of my medical decision making, as well as reviewing the written report by the radiologist.  ED MD interpretation: None  Official radiology report(s): No results found.  ____________________________________________   PROCEDURES  Procedure(s) performed (including Critical Care):  Procedures   ____________________________________________   INITIAL IMPRESSION / ASSESSMENT AND PLAN / ED COURSE  As part of my medical decision making, I reviewed the following data within the electronic MEDICAL RECORD NUMBER Nursing notes reviewed and incorporated, Old chart reviewed and Notes from prior ED visits     25 year old male with a history of migraines presenting with headache. Differential diagnosis includes, but is not limited to, intracranial hemorrhage, meningitis/encephalitis, previous head trauma, cavernous venous thrombosis, tension headache, temporal arteritis, migraine or migraine equivalent, idiopathic intracranial hypertension, and non-specific headache.  Neck is supple and there are no focal neurological deficits on examination.  No fever to suggest meningitis.  Will administer IV Toradol, Reglan and Decadron.  Will reassess.  Patient reports PCP has been managing his migraines and would like a referral to neurology.  Clinical Course as of 09/25/20 0709  Wed Sep 25, 2020  0707 Patient feeling significantly better.  Will discharge home with prescription for Reglan and neurology referral.  Strict return precautions given.  Patient verbalizes understanding and agrees with plan of care. [JS]    Clinical Course User Index [JS] Irean Hong, MD     ____________________________________________   FINAL CLINICAL  IMPRESSION(S) / ED DIAGNOSES  Final diagnoses:  Other migraine without status migrainosus, not intractable     ED Discharge Orders         Ordered    metoCLOPramide (REGLAN) 10 MG tablet  Every 8 hours PRN        09/25/20 0708          *Please note:  Kamonte Mcmichen was evaluated in Emergency Department on 09/25/2020 for the symptoms described in the history of present illness. He was evaluated in the context of the global COVID-19 pandemic, which necessitated consideration that the patient might be at risk for infection with the SARS-CoV-2 virus that causes COVID-19. Institutional protocols and algorithms that pertain to the evaluation of patients at risk for COVID-19 are in a state of rapid change based on information released by regulatory bodies including the CDC and federal and state organizations. These policies and algorithms were followed during the patient's care in the ED.  Some ED evaluations and interventions may be delayed as a result of limited staffing during and the pandemic.*   Note:  This document was prepared using Dragon voice recognition software and may include unintentional dictation errors.   Irean Hong, MD 09/25/20 [phone]

## 2021-02-01 ENCOUNTER — Emergency Department
Admission: EM | Admit: 2021-02-01 | Discharge: 2021-02-01 | Disposition: A | Discharge status: Home / Self Care | Payer: 59 | Attending: Emergency Medicine | Admitting: Emergency Medicine

## 2021-02-01 ENCOUNTER — Encounter: Payer: Self-pay | Admitting: Emergency Medicine

## 2021-02-01 ENCOUNTER — Other Ambulatory Visit: Payer: Self-pay

## 2021-02-01 DIAGNOSIS — Z91010 Allergy to peanuts: Secondary | ICD-10-CM | POA: Insufficient documentation

## 2021-02-01 DIAGNOSIS — J45909 Unspecified asthma, uncomplicated: Secondary | ICD-10-CM | POA: Insufficient documentation

## 2021-02-01 DIAGNOSIS — W540XXA Bitten by dog, initial encounter: Secondary | ICD-10-CM | POA: Insufficient documentation

## 2021-02-01 DIAGNOSIS — Z7951 Long term (current) use of inhaled steroids: Secondary | ICD-10-CM | POA: Insufficient documentation

## 2021-02-01 DIAGNOSIS — Z87891 Personal history of nicotine dependence: Secondary | ICD-10-CM | POA: Insufficient documentation

## 2021-02-01 DIAGNOSIS — S61452A Open bite of left hand, initial encounter: Secondary | ICD-10-CM

## 2021-02-01 DIAGNOSIS — S61257A Open bite of left little finger without damage to nail, initial encounter: Secondary | ICD-10-CM | POA: Insufficient documentation

## 2021-02-01 DIAGNOSIS — L089 Local infection of the skin and subcutaneous tissue, unspecified: Secondary | ICD-10-CM

## 2021-02-01 MED ORDER — AMOXICILLIN-POT CLAVULANATE 875-125 MG PO TABS: 1.0000 | ORAL_TABLET | Freq: Two times a day (BID) | ORAL | 0 refills | Status: AC

## 2021-02-01 MED ORDER — LIDOCAINE HCL (PF) 1 % IJ SOLN: 5.0000 mL | Freq: Once | INTRAMUSCULAR | Status: AC

## 2021-02-01 MED ORDER — LIDOCAINE HCL (PF) 1 % IJ SOLN
INTRAMUSCULAR | Status: AC
  Administered 2021-02-01: 5 mL
  Filled 2021-02-01: qty 5

## 2021-02-01 NOTE — ED Provider Notes (Signed)
[facility] Emergency Department Provider Note   ____________________________________________   Event Date/Time   First MD Initiated Contact with Patient 02/01/21 1648     (approximate)  I have reviewed the triage vital signs and the nursing notes.   HISTORY  Chief Complaint Animal Bite    HPI Michael Reilly is a 25 y.o. male who presents for a dog bite to the left fifth digit  LOCATION: Left fifth digit DURATION: Approximately 1 hour prior to arrival TIMING: Stable since onset SEVERITY: 6/10 QUALITY: Burning/aching CONTEXT: States that one of his dogs was attacking another one of his dogs and he tried to intervene suffering a bite to the left fifth digit at the tip MODIFYING FACTORS: Any palpation or movement of the pinky worsens this pain and he denies any relieving factors ASSOCIATED SYMPTOMS: Denies   Per medical record review, noncontributory          Past Medical History:  Diagnosis Date   Asthma    Insomnia     There are no problems to display for this patient.   History reviewed. No pertinent surgical history.  Prior to Admission medications   Medication Sig Start Date End Date Taking? Authorizing Provider  albuterol (VENTOLIN HFA) 108 (90 Base) MCG/ACT inhaler Inhale into the lungs. 02/27/20  Yes [provider]  amoxicillin-clavulanate (AUGMENTIN) 875-125 MG tablet Take 1 tablet by mouth 2 (two) times daily for 10 days. 02/01/21 02/11/21 Yes Merwyn Katos, MD  azithromycin (ZITHROMAX) 250 MG tablet 2 tablets on day 1, then 1 tablet daily on days 2-5. 04/17/19   Tommie Sams, DO  cetirizine (ZYRTEC) 10 MG chewable tablet Chew 1 tablet (10 mg total) by mouth daily. 07/28/15 04/17/19  Betancourt, Jarold Song, NP  metoCLOPramide (REGLAN) 10 MG tablet Take 1 tablet (10 mg total) by mouth every 8 (eight) hours as needed for nausea (headache). 09/25/20   Irean Hong, MD  fluticasone (FLONASE) 50 MCG/ACT nasal spray Place 1 spray  into both nostrils 2 (two) times daily. 07/28/15 04/17/19  Betancourt, Jarold Song, NP  sodium chloride (OCEAN) 0.65 % SOLN nasal spray Place 2 sprays into both nostrils every 2 (two) hours while awake. 07/28/15 04/17/19  Betancourt, Jarold Song, NP    Allergies Peanuts [peanut oil], Shellfish allergy, and Iodine  History reviewed. No pertinent family history.  Social History Social History   Tobacco Use   Smoking status: Former   Smokeless tobacco: Never  Building services engineer Use: Never used  Substance Use Topics   Alcohol use: No   Drug use: Never    Review of Systems Constitutional: No fever/chills Eyes: No visual changes. ENT: No sore throat. Cardiovascular: Denies chest pain. Respiratory: Denies shortness of breath. Gastrointestinal: No abdominal pain.  No nausea, no vomiting.  No diarrhea. Genitourinary: Negative for dysuria. Musculoskeletal: Positive for acute left fifth digit pain Skin: Avulsion injury to tip of left fifth digit negative for rash. Neurological: Negative for headaches, weakness/numbness/paresthesias in any extremity Psychiatric: Negative for suicidal ideation/homicidal ideation   ____________________________________________   PHYSICAL EXAM:  VITAL SIGNS: ED Triage Vitals  Enc Vitals Group     BP 02/01/21 1619 (!) 152/93     Pulse Rate 02/01/21 1619 89     Resp 02/01/21 1619 20     Temp 02/01/21 1619 99 F (37.2 C)     Temp Source 02/01/21 1619 Oral     SpO2 02/01/21 1619 100 %     Weight 02/01/21 1620  181 lb (82.1 kg)     Height 02/01/21 1620 5\' 5"  (1.651 m)     Head Circumference --      Peak Flow --      Pain Score 02/01/21 1620 6     Pain Loc --      Pain Edu? --      Excl. in GC? --    Constitutional: Alert and oriented. Well appearing and in no acute distress. Eyes: Conjunctivae are normal. PERRL. Head: Atraumatic. Nose: No congestion/rhinnorhea. Mouth/Throat: Mucous membranes are moist. Neck: No stridor Cardiovascular: Grossly normal  heart sounds.  Good peripheral circulation. Respiratory: Normal respiratory effort.  No retractions. Gastrointestinal: Soft and nontender. No distention. Musculoskeletal: No obvious deformities Neurologic:  Normal speech and language. No gross focal neurologic deficits are appreciated. Skin: Avulsion injury with bleeding to the tip of the left fifth digit with no nailbed injury.  Skin is warm and dry. No rash noted. Psychiatric: Mood and affect are normal. Speech and behavior are normal.  ____________________________________________   LABS (all labs ordered are listed, but only abnormal results are displayed)  Labs Reviewed - No data to display ____________________________________________ PROCEDURES  Procedure(s) performed (including Critical Care):  10/06/22Marland KitchenLaceration Repair  Date/Time: 02/01/2021 6:14 PM Performed by: 04/03/2021, MD Authorized by: Merwyn Katos, MD   Consent:    Consent obtained:  Verbal   Consent given by:  Patient   Risks discussed:  Infection and poor wound healing   Alternatives discussed:  Delayed treatment Universal protocol:    Immediately prior to procedure, a time out was called: yes     Patient identity confirmed:  Verbally with patient Anesthesia:    Anesthesia method:  Nerve block   Block location:  Left fifth digit   Block needle gauge:  25 G   Block anesthetic:  Lidocaine 1% w/o epi   Block technique:  Digital block   Block injection procedure:  Anatomic landmarks identified, introduced needle and negative aspiration for blood   Block outcome:  Anesthesia achieved Laceration details:    Location:  Finger   Finger location:  L small finger   Length (cm):  1   Depth (mm):  4 Pre-procedure details:    Preparation:  Patient was prepped and draped in usual sterile fashion Exploration:    Limited defect created (wound extended): no     Wound exploration: entire depth of wound visualized     Contaminated: no   Treatment:    Area cleansed  with:  Saline   Amount of cleaning:  Standard   Irrigation solution:  Sterile saline   Irrigation volume:  200   Irrigation method:  Syringe   Visualized foreign bodies/material removed: no     Debridement:  None   Undermining:  None   Scar revision: no   Skin repair:    Repair method:  Steri-Strips   Number of Steri-Strips:  1 Approximation:    Approximation:  Loose Repair type:    Repair type:  Simple Post-procedure details:    Dressing:  Non-adherent dressing and sterile dressing   Procedure completion:  Tolerated well, no immediate complications   ____________________________________________   INITIAL IMPRESSION / ASSESSMENT AND PLAN / ED COURSE  As part of my medical decision making, I reviewed the following data within the electronic medical record, if available:  Nursing notes reviewed and incorporated, Labs reviewed, EKG interpreted, Old chart reviewed, Radiograph reviewed and Notes from prior ED visits reviewed and incorporated  Patient had a laceration that was repaired in the ED after copious irrigation.  Please see laceration procedure note for further details.  After exploration of the wound, there was no evidence of a retained foreign body. No evidence of underlying fracture. TDAP: UTD Interventions: Given dog bite, Augmentin for 10 days Disposition: Discharge. Patient has been given strict wound return precautions and instructions to follow up with their PMD in 2 days for a wound recheck.      ____________________________________________   FINAL CLINICAL IMPRESSION(S) / ED DIAGNOSES  Final diagnoses:  Infected dog bite of hand including fingers, left, initial encounter     ED Discharge Orders          Ordered    amoxicillin-clavulanate (AUGMENTIN) 875-125 MG tablet  2 times daily        02/01/21 1731             Note:  This document was prepared using Dragon voice recognition software and may include unintentional dictation  errors.    Merwyn Katos, MD 02/01/21 [phone]

## 2021-02-01 NOTE — ED Triage Notes (Signed)
Pt via POV from home. Pt c/o of a dog bite to the L hand, pt states it happened around 3:45pm pt states it was his dog. Shot records are updated per pt. This RN assessed pinky finger. Seemed that the dog may have bit off the tip of his L pinky. Wound still actively bleeding but covered at this time. Pt is A&Ox4 and NAD

## 2023-04-05 DIAGNOSIS — Z23 Encounter for immunization: Secondary | ICD-10-CM | POA: Diagnosis not present

## 2023-04-05 DIAGNOSIS — Z051 Observation and evaluation of newborn for suspected infectious condition ruled out: Secondary | ICD-10-CM | POA: Diagnosis not present

## 2023-04-12 DIAGNOSIS — R634 Abnormal weight loss: Secondary | ICD-10-CM | POA: Diagnosis not present

## 2023-04-12 DIAGNOSIS — H04539 Neonatal obstruction of unspecified nasolacrimal duct: Secondary | ICD-10-CM | POA: Diagnosis not present

## 2024-04-08 ENCOUNTER — Encounter: Payer: Self-pay | Admitting: Emergency Medicine

## 2024-04-08 ENCOUNTER — Ambulatory Visit
Admission: EM | Admit: 2024-04-08 | Discharge: 2024-04-08 | Disposition: A | Discharge status: Home / Self Care | Attending: Emergency Medicine | Admitting: Emergency Medicine

## 2024-04-08 DIAGNOSIS — M545 Low back pain, unspecified: Secondary | ICD-10-CM | POA: Insufficient documentation

## 2024-04-08 LAB — URINALYSIS, W/ REFLEX TO CULTURE (INFECTION SUSPECTED)
Bacteria, UA: NONE SEEN
Bilirubin Urine: NEGATIVE
Glucose, UA: NEGATIVE mg/dL
Hgb urine dipstick: NEGATIVE
Ketones, ur: NEGATIVE mg/dL
Leukocytes,Ua: NEGATIVE
Nitrite: NEGATIVE
Protein, ur: NEGATIVE mg/dL
RBC / HPF: NONE SEEN RBC/hpf (ref 0–5)
Specific Gravity, Urine: 1.02 (ref 1.005–1.030)
Squamous Epithelial / HPF: NONE SEEN /HPF (ref 0–5)
WBC, UA: NONE SEEN WBC/hpf (ref 0–5)
pH: 7.5 (ref 5.0–8.0)

## 2024-04-08 MED ORDER — BACLOFEN 10 MG PO TABS: 10.0000 mg | ORAL_TABLET | Freq: Three times a day (TID) | ORAL | 0 refills | Status: AC

## 2024-04-08 MED ORDER — IBUPROFEN 600 MG PO TABS: 600.0000 mg | ORAL_TABLET | Freq: Four times a day (QID) | ORAL | 0 refills | Status: AC | PRN

## 2024-04-08 NOTE — Discharge Instructions (Signed)

## 2024-04-08 NOTE — ED Provider Notes (Signed)
 [facility]    CSN: [id number] Arrival date & time: 04/08/24  1230      History   Chief Complaint Chief Complaint  Patient presents with   Back Pain    HPI Michael Reilly is a 28 y.o. male.   HPI  28 year old male with past medical history significant for asthma and insomnia presents for evaluation of left flank pain that started when he woke up at around 10 AM this morning.  He reports that the pain is a cramping pain initially was happening every 10 minutes and now it is happening approximately every 45 to 60 seconds.  The area is tender to touch and does increase slightly with movement.  He denies any pain with urination, urinary urgency or frequency, or hematuria.  No history of renal issues or UTIs.  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 No pertinent surgical history.     Home Medications    Prior to Admission medications   Medication Sig Start Date End Date Taking? Authorizing Provider  baclofen (LIORESAL) 10 MG tablet Take 1 tablet (10 mg total) by mouth 3 (three) times daily. 04/08/24  Yes Bernardino Ditch, NP  Eluxadoline 75 MG TABS Take 75 mg by mouth. 05/27/20  Yes [provider]  FLUoxetine (PROZAC) 20 MG capsule Take 20 mg by mouth. 11/04/22  Yes [provider]  ibuprofen  (ADVIL ) 600 MG tablet Take 1 tablet (600 mg total) by mouth every 6 (six) hours as needed. 04/08/24  Yes Bernardino Ditch, NP  omeprazole (PRILOSEC) 40 MG capsule Take 40 mg by mouth. 02/06/20  Yes [provider]  topiramate (TOPAMAX) 25 MG tablet Take 25 mg by mouth daily. 02/07/20  Yes [provider]  albuterol (VENTOLIN HFA) 108 (90 Base) MCG/ACT inhaler Inhale into the lungs. 02/27/20   [provider]  cetirizine  (ZYRTEC ) 10 MG chewable tablet Chew 1 tablet (10 mg total) by mouth daily. 07/28/15 04/17/19  Betancourt, Ellouise LABOR, NP  metoCLOPramide  (REGLAN ) 10 MG tablet Take 1 tablet (10 mg total) by mouth every 8 (eight) hours as needed for nausea (headache). 09/25/20   Sung, Jade J, MD  fluticasone  (FLONASE ) 50 MCG/ACT nasal spray Place 1 spray into both nostrils 2 (two) times daily. 07/28/15 04/17/19  Betancourt, Ellouise LABOR, NP  sodium chloride (OCEAN) 0.65 % SOLN nasal spray Place 2 sprays into both nostrils every 2 (two) hours while awake. 07/28/15 04/17/19  Betancourt, Ellouise LABOR, NP    Family History History reviewed. No pertinent family history.  Social History Social History   Tobacco Use   Smoking status: Former    Types: Cigarettes    Passive exposure: Current   Smokeless tobacco: Never  Vaping Use   Vaping status: Every Day   Substances: Nicotine, Flavoring  Substance Use Topics   Alcohol use: No   Drug use: Never     Allergies   Peanuts [peanut oil], Shellfish allergy, Shellfish protein-containing drug products, and Iodine   Review of Systems Review of Systems  Genitourinary:  Positive for flank pain. Negative for dysuria, frequency, hematuria and urgency.     Physical Exam Triage Vital Signs ED Triage Vitals  Encounter Vitals Group     BP  Girls Systolic BP Percentile      Girls Diastolic BP Percentile      Boys Systolic BP Percentile      Boys Diastolic BP Percentile      Pulse      Resp      Temp      Temp src      SpO2      Weight      Height      Head Circumference      Peak Flow      Pain Score      Pain Loc      Pain Education      Exclude from Growth Chart    No data found.  Updated Vital Signs BP (!) 139/91 (BP Location: Right Arm)   Pulse 81   Temp (!) 97.5 F (36.4 C) (Temporal)   Resp 16   Wt 185 lb (83.9 kg)   SpO2 97%   BMI 30.79 kg/m   Visual Acuity Right Eye Distance:   Left Eye Distance:   Bilateral  Distance:    Right Eye Near:   Left Eye Near:    Bilateral Near:     Physical Exam Vitals and nursing note reviewed.  Constitutional:      Appearance: Normal appearance. He is not ill-appearing.  HENT:     Head: Normocephalic and atraumatic.  Abdominal:     Tenderness: There is no right CVA tenderness or left CVA tenderness.  Musculoskeletal:        General: Tenderness present. No signs of injury.  Skin:    General: Skin is warm and dry.     Capillary Refill: Capillary refill takes less than 2 seconds.     Findings: No bruising or erythema.  Neurological:     General: No focal deficit present.     Mental Status: He is alert and oriented to person, place, and time.      UC Treatments / Results  Labs (all labs ordered are listed, but only abnormal results are displayed) Labs Reviewed  URINALYSIS, W/ REFLEX TO CULTURE (INFECTION SUSPECTED)    EKG   Radiology No results found.  Procedures Procedures (including critical care time)  Medications Ordered in UC Medications - No data to display  Initial Impression / Assessment and Plan / UC Course  I have reviewed the triage vital signs and the nursing notes.  Pertinent labs & imaging results that were available during my care of the patient were reviewed by me and considered in my medical decision making (see chart for details).   Patient is a nontoxic-appearing 28 year old male presenting for evaluation of acute onset left flank pain as outlined in HPI above.  In the exam room he does not appear to be in any acute distress.  He is sitting in a relaxed state on the exam table and is pretty conversant during the history gathering process.  The pain was acute onset this morning and has increased in frequency and intensity since it began at 10 AM.  The pain does increase with lateral rotation but not with flexion or extension of the back.  He does have tenderness with palpation of his left flank but no CVA tenderness.  He also  has no midline spinous process tenderness or step-off in his lower thoracic or lumbar spine.  I suspect this is most likely musculoskeletal in nature.  However, I will obtain a UA to evaluate for any potential UTI or kidney stone that may be  developing.  Urinalysis is completely unremarkable.  I will discharge patient home with diagnosis of musculoskeletal low back pain and start him on ibuprofen  and baclofen.  Also I recommended moist heat application to improve blood flow to the muscle and aid in healing.  Home physical therapy exercises also prescribed.  Return precautions reviewed.   Final Clinical Impressions(s) / UC Diagnoses   Final diagnoses:  Acute left-sided low back pain without sciatica     Discharge Instructions      Take the ibuprofen , 600 mg every 6 hours with food, on a schedule for the next 48 hours and then as needed.  Take the baclofen, 10 mg every 8 hours, on a schedule for the next 48 hours and then as needed.  Apply moist heat to your back for 30 minutes at a time 2-3 times a day to improve blood flow to the area and help remove the lactic acid causing the spasm.  Follow the back exercises given at discharge.  Return for reevaluation for any new or worsening symptoms.      ED Prescriptions     Medication Sig Dispense Auth. Provider   ibuprofen  (ADVIL ) 600 MG tablet Take 1 tablet (600 mg total) by mouth every 6 (six) hours as needed. 30 tablet Bernardino Ditch, NP   baclofen (LIORESAL) 10 MG tablet Take 1 tablet (10 mg total) by mouth 3 (three) times daily. 30 each Bernardino Ditch, NP      PDMP not reviewed this encounter.   Bernardino Ditch, NP 04/08/24 [phone]

## 2024-04-08 NOTE — ED Triage Notes (Signed)
 Pt presents c/o lower left back pain x 1 day. Pt reports he is getting the pain every 45 to 60 seconds.  Pt reports he has not used the restroom since 9:45 am and the area is tender to touch.

## 2024-04-10 ENCOUNTER — Ambulatory Visit
Admission: RE | Admit: 2024-04-10 | Discharge: 2024-04-10 | Disposition: A | Discharge status: Home / Self Care | Payer: Worker's Compensation | Attending: Gastroenterology | Admitting: Gastroenterology

## 2024-04-10 ENCOUNTER — Other Ambulatory Visit: Payer: Self-pay | Admitting: Gastroenterology

## 2024-04-10 ENCOUNTER — Ambulatory Visit
Admission: RE | Admit: 2024-04-10 | Discharge: 2024-04-10 | Disposition: A | Discharge status: Home / Self Care | Payer: Worker's Compensation | Source: Ambulatory Visit | Attending: Gastroenterology | Admitting: Gastroenterology

## 2024-04-10 DIAGNOSIS — S9781XA Crushing injury of right foot, initial encounter: Secondary | ICD-10-CM | POA: Diagnosis present
